# Patient Record
Sex: Female | Born: 1985 | Race: Black or African American | Hispanic: No | Marital: Single | Smoking: Never smoker
Health system: Southern US, Community
[De-identification: ages and names within clinical notes are randomized; demographics above are authoritative.]

## PROBLEM LIST (undated history)

## (undated) DIAGNOSIS — E282 Polycystic ovarian syndrome: Secondary | ICD-10-CM

## (undated) DIAGNOSIS — E7800 Pure hypercholesterolemia, unspecified: Secondary | ICD-10-CM

---

## 2006-07-31 ENCOUNTER — Inpatient Hospital Stay (HOSPITAL_COMMUNITY): Admission: AD | Admit: 2006-07-31 | Discharge: 2006-08-01 | Payer: Self-pay | Admitting: Obstetrics & Gynecology

## 2006-09-06 ENCOUNTER — Inpatient Hospital Stay (HOSPITAL_COMMUNITY): Admission: AD | Admit: 2006-09-06 | Discharge: 2006-09-06 | Payer: Self-pay | Admitting: Obstetrics

## 2006-09-13 ENCOUNTER — Inpatient Hospital Stay (HOSPITAL_COMMUNITY): Admission: EM | Admit: 2006-09-13 | Discharge: 2006-09-13 | Payer: Self-pay | Admitting: Obstetrics

## 2006-11-03 ENCOUNTER — Encounter (INDEPENDENT_AMBULATORY_CARE_PROVIDER_SITE_OTHER): Payer: Self-pay | Admitting: *Deleted

## 2006-11-03 ENCOUNTER — Inpatient Hospital Stay (HOSPITAL_COMMUNITY): Admission: RE | Admit: 2006-11-03 | Discharge: 2006-11-06 | Payer: Self-pay | Admitting: Obstetrics

## 2009-07-16 ENCOUNTER — Ambulatory Visit (HOSPITAL_COMMUNITY): Admission: RE | Admit: 2009-07-16 | Discharge: 2009-07-16 | Payer: Self-pay | Admitting: Obstetrics

## 2009-09-11 ENCOUNTER — Ambulatory Visit (HOSPITAL_COMMUNITY): Admission: RE | Admit: 2009-09-11 | Discharge: 2009-09-11 | Payer: Self-pay | Admitting: Obstetrics

## 2009-10-09 IMAGING — US US OB COMP +14 WK
2 series · 14 of 28 positions shown · non-contrast
Comparison: none

OBSTETRICAL ULTRASOUND:
 This ultrasound exam was performed in the [HOSPITAL] Ultrasound Department.  The OB US report was generated in the AS system, and faxed to the ordering physician.  This report is also available in [REDACTED] PACS.

[Series 1: us ob comp +14 wk · 0.21mm/px · 2 of 11 slices shown (1 of 2)]
[im 3/11]
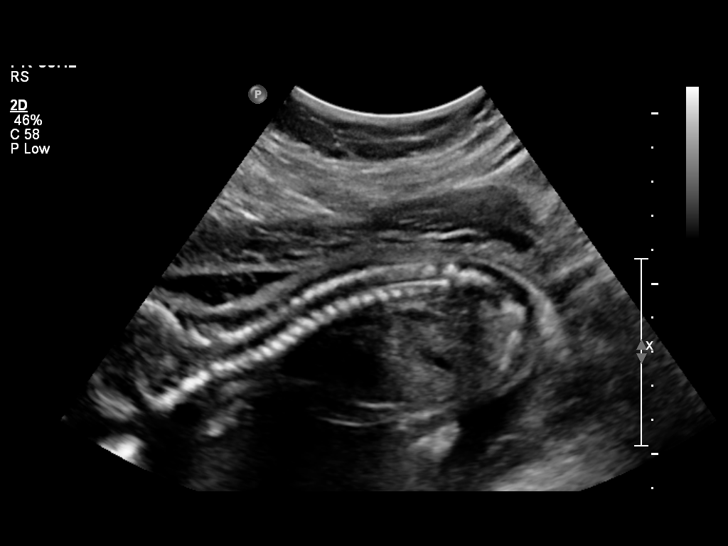
[im 8/11]
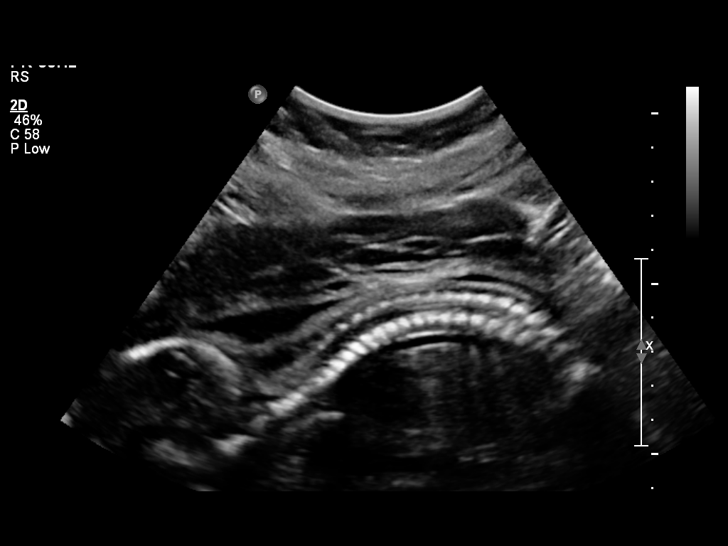

[Series 1: us ob comp +14 wk · 0.20mm/px · 12 of 55 slices shown (2 of 2)]
[im 1/55]
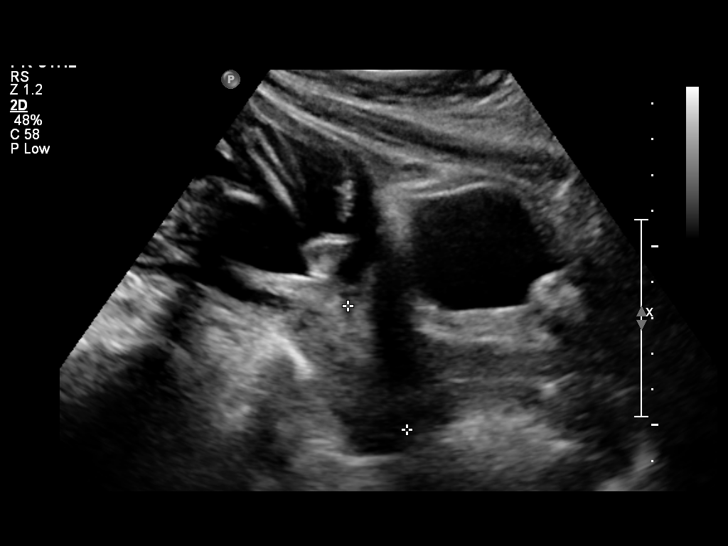
[im 5/55]
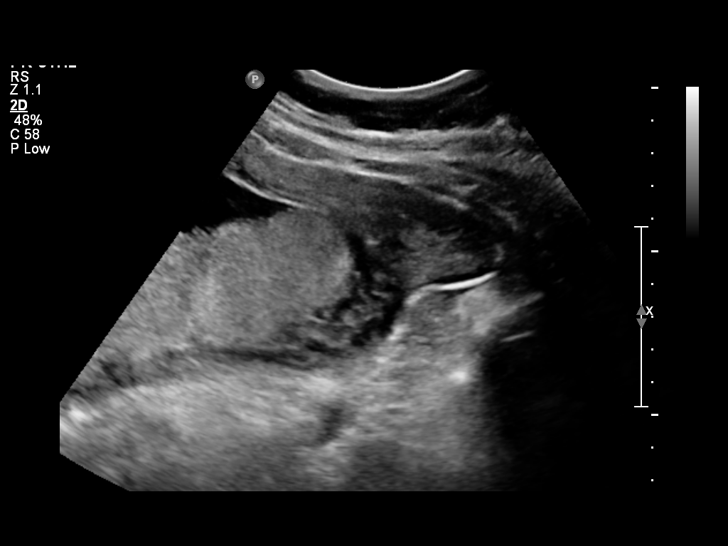
[im 10/55]
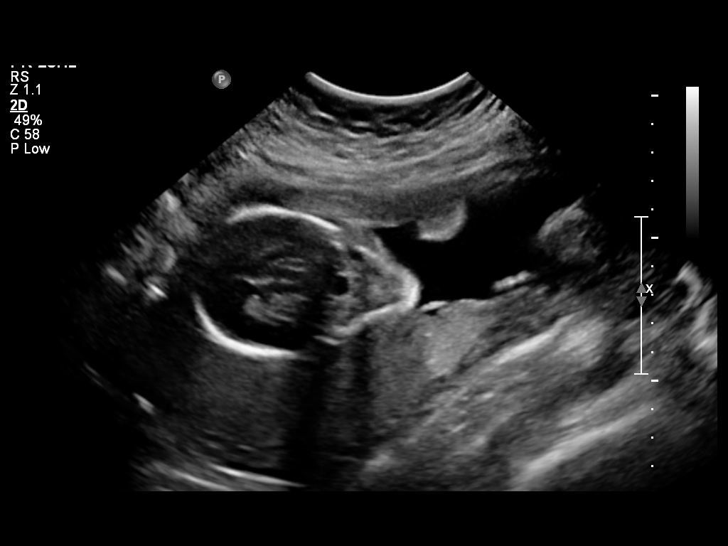
[im 15/55]
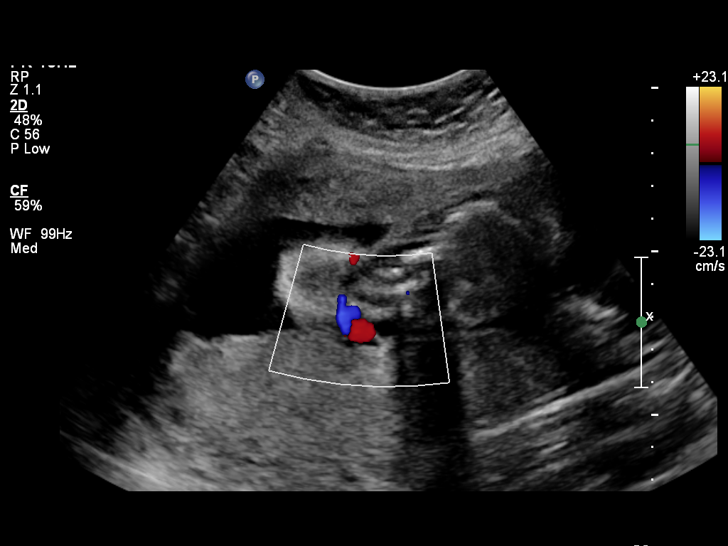
[im 20/55]
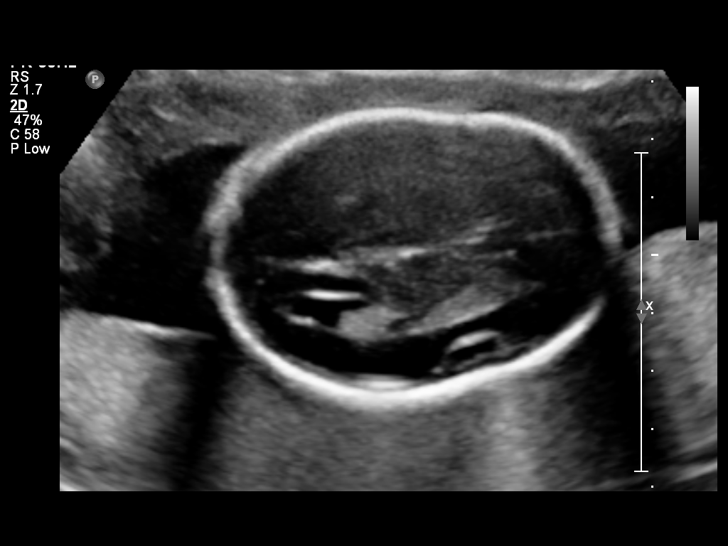
[im 25/55]
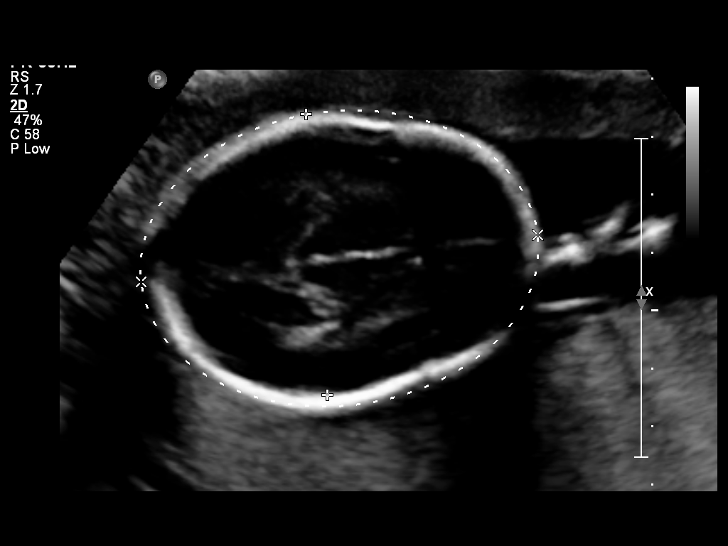
[im 30/55]
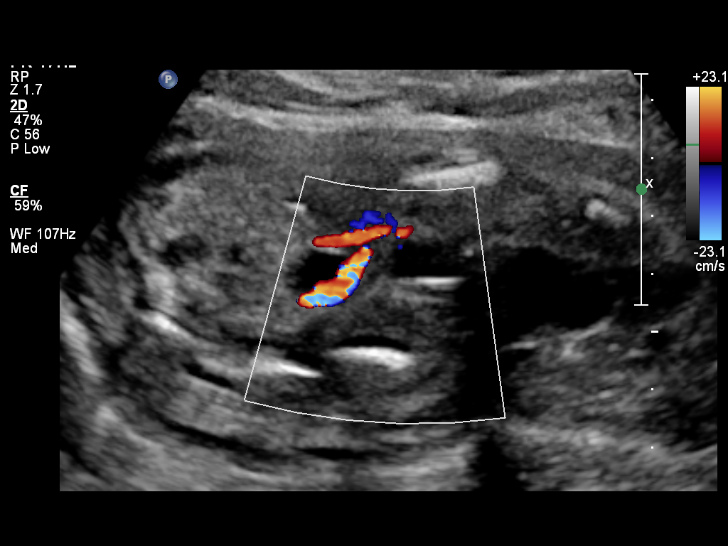
[im 35/55]
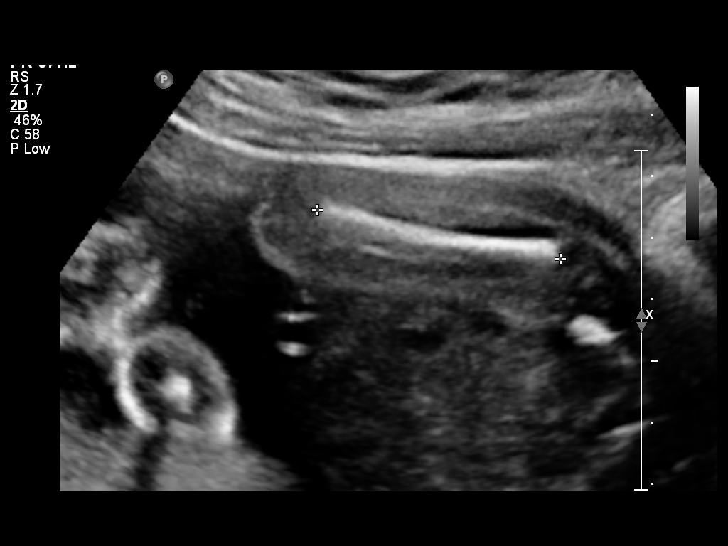
[im 40/55]
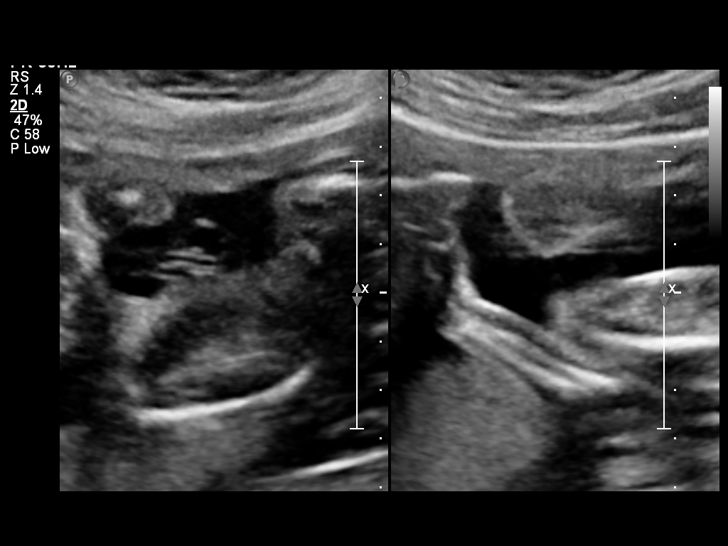
[im 45/55]
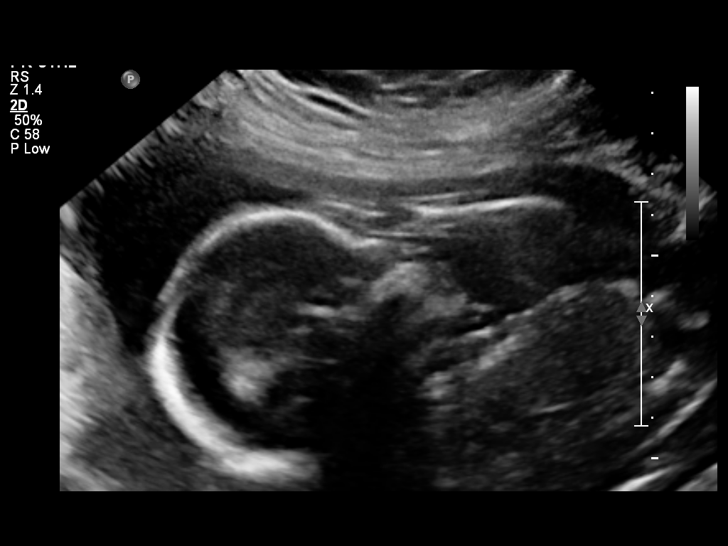
[im 50/55]
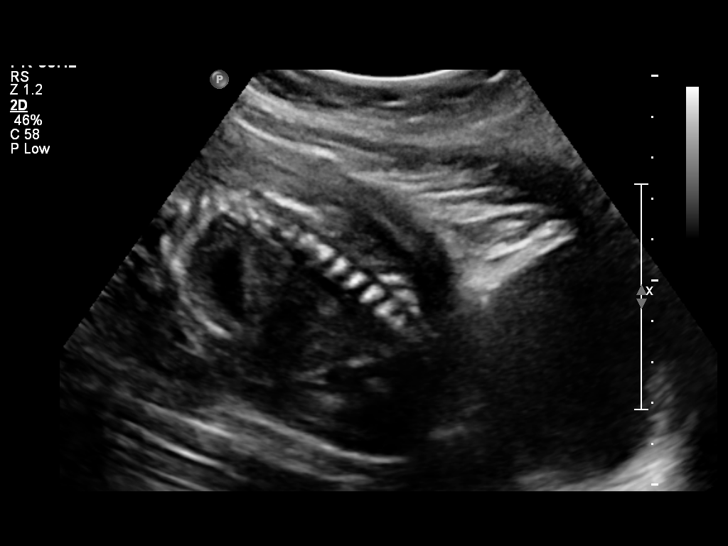
[im 55/55]
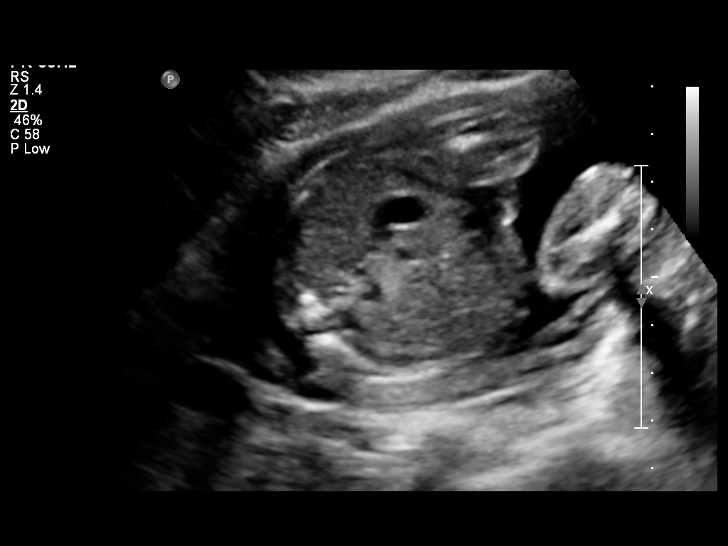

[14 of 28 positions shown; findings below may reference images not displayed]

IMPRESSION: See AS Obstetric US report.

## 2009-11-20 ENCOUNTER — Inpatient Hospital Stay (HOSPITAL_COMMUNITY): Admission: EM | Admit: 2009-11-20 | Discharge: 2009-11-23 | Payer: Self-pay | Admitting: Obstetrics

## 2011-03-07 LAB — CBC
HCT: 26.8 % — ABNORMAL LOW (ref 36.0–46.0)
MCHC: 31.8 g/dL (ref 30.0–36.0)
MCHC: 31.8 g/dL (ref 30.0–36.0)
MCV: 75.2 fL — ABNORMAL LOW (ref 78.0–100.0)
Platelets: 203 10*3/uL (ref 150–400)
RBC: 4.47 MIL/uL (ref 3.87–5.11)
WBC: 9.6 10*3/uL (ref 4.0–10.5)

## 2011-03-07 LAB — RPR: RPR Ser Ql: NONREACTIVE

## 2011-03-30 ENCOUNTER — Inpatient Hospital Stay (INDEPENDENT_AMBULATORY_CARE_PROVIDER_SITE_OTHER)
Admission: RE | Admit: 2011-03-30 | Discharge: 2011-03-30 | Disposition: A | Discharge status: Home / Self Care | Payer: BC Managed Care – PPO | Source: Ambulatory Visit | Attending: Emergency Medicine | Admitting: Emergency Medicine

## 2011-03-30 DIAGNOSIS — M545 Low back pain: Secondary | ICD-10-CM

## 2011-03-30 LAB — POCT URINALYSIS DIP (DEVICE)
Glucose, UA: NEGATIVE mg/dL
Specific Gravity, Urine: >=1.03 (ref 1.005–1.030)
Urobilinogen, UA: 2 mg/dL — ABNORMAL HIGH (ref 0.0–1.0)
pH: 6.5 (ref 5.0–8.0)

## 2011-03-30 LAB — POCT PREGNANCY, URINE: Preg Test, Ur: NEGATIVE

## 2011-04-22 NOTE — Discharge Summary (Signed)
NAMELATORSHA, CURLING NO.:  [id number]   MEDICAL RECORD NO.:  [id number]          PATIENT TYPE:  INP   LOCATION:  9107                          FACILITY:  [facility]   PHYSICIAN:  Kathreen Cosier, M.D.DATE OF BIRTH:  [birth year]   DATE OF ADMISSION:  11/03/2006  DATE OF DISCHARGE:  11/06/2006                               DISCHARGE SUMMARY   The patient is a 25 year old primigravida, [facility] November 24 who was  admitted with ruptured membranes. Fluid was clear. She had a positive  GBS. The cervix was 1 cm, 70%, vertex -3. the patient underwent primary  low transverse Cesarean section because of failure to progress in labor.  Postoperatively, her hemoglobin was 10.7. RPR negative. Urine negative.  She did well and was discharged on the third postoperative day,  ambulatory, on a regular diet, to see me in six weeks.   DISCHARGE DIAGNOSIS:  Status post primary low transverse Cesarean  section at term for failure to progress in labor.           ______________________________  Kathreen Cosier, M.D.     BAM/MEDQ  D:  11/22/2006  T:  11/22/2006  Job:  [id number]

## 2011-04-22 NOTE — H&P (Signed)
NAMEJAYCEY, Amber Collins NO.:  [id number]   MEDICAL RECORD NO.:  [id number]          PATIENT TYPE:  INP   LOCATION:  9107                          FACILITY:  [facility]   PHYSICIAN:  Kathreen Cosier, M.D.DATE OF BIRTH:  [birth year]   DATE OF ADMISSION:  11/03/2006  DATE OF DISCHARGE:                              HISTORY & PHYSICAL   The patient is a 25 year old Primigravida, [facility] October 28, 2006,  admitted with ruptured membranes which occurred at 6 a.m.  Fluid was  clear.  She was having occasional contractions.  Positive GBS, was  treated with penicillin.  Cervix 1-cm, 70%, vertex, minus 3.  She was  started on Pitocin stimulation and in the early afternoon she was in  good labor and _______.  By 5 p.m. she was 5-cm, 100%, vertex, minus 3.  By 9:30 p.m. she was unchanged except for significant molding.  It was  decided she would deliver by C-section because of failure to progress.   PHYSICAL EXAMINATION:  GENERAL:  Revealed a well developed female in  labor.  HEENT:  Negative.  LUNGS:  Clear.  HEART:  Regular rhythm.  No murmurs.  No gallops.  BREASTS:  No masses.  ABDOMEN:  Term size uterus.  Estimated fetal weight 7.5 ounces.  EXTREMITIES:  Negative.           ______________________________  Kathreen Cosier, M.D.     BAM/MEDQ  D:  11/03/2006  T:  11/04/2006  Job:  [id number]

## 2011-04-22 NOTE — Op Note (Signed)
Amber Collins, DETORE NO.:  [id number]   MEDICAL RECORD NO.:  [id number]          PATIENT TYPE:  INP   LOCATION:  9107                          FACILITY:  [facility]   PHYSICIAN:  Kathreen Cosier, M.D.DATE OF BIRTH:  Mar 27, 1986   DATE OF PROCEDURE:  11/03/2006  DATE OF DISCHARGE:                               OPERATIVE REPORT   PREOPERATIVE DIAGNOSIS:  Failure to progress in labor.   SURGEON:  Kathreen Cosier, M.D.   ANESTHESIA:  Epidural.   DESCRIPTION OF PROCEDURE:  The patient placed on the operating table in  the supine position.  Abdomen prepped and draped.  Bladder emptied with  Foley catheter.  Transverse suprapubic incision made, carried down  through the rectus fascia.  The fascia was cleaned and incised the  length of the incision.  Rectus muscle were retracted laterally.  The  peritoneum was incised longitudinally.  Transverse incision made in the  vesicouterine peritoneum above the bladder.  The bladder mobilized  inferiorly.  Transverse lower uterine incision made.  The patient was  delivered from the OP position of a female, Apgars 8 and 9, weighing 6  pounds 11 ounces.  The team was in attendance.  Fluid was clear.  Placenta was posterior and removed manually.  Uterine cavity was cleaned  with dry laps.  Uterine incision closed in one layer with continuous  suture of #1 chromic.  Tubes and ovaries normal.  Uterus well  contracted.  Bladder flap reattached with 2-0 chromic.  Abdomen closed  in layers.  Peritoneum with continuous suture of 0 chromic, fascia with  continuous suture of 0 Dexon and skin closed with subcuticular stitch of  4-0 Monocryl.  Blood loss 500 mL.           ______________________________  Kathreen Cosier, M.D.     BAM/MEDQ  D:  11/03/2006  T:  11/05/2006  Job:  [id number]

## 2011-08-21 ENCOUNTER — Inpatient Hospital Stay (INDEPENDENT_AMBULATORY_CARE_PROVIDER_SITE_OTHER)
Admission: RE | Admit: 2011-08-21 | Discharge: 2011-08-21 | Disposition: A | Discharge status: Home / Self Care | Payer: BC Managed Care – PPO | Source: Ambulatory Visit | Attending: Emergency Medicine | Admitting: Emergency Medicine

## 2011-08-21 DIAGNOSIS — N760 Acute vaginitis: Secondary | ICD-10-CM

## 2011-08-21 LAB — POCT URINALYSIS DIP (DEVICE)
Ketones, ur: NEGATIVE mg/dL
pH: 5.5 (ref 5.0–8.0)

## 2011-08-21 LAB — POCT PREGNANCY, URINE: Preg Test, Ur: NEGATIVE

## 2011-08-21 LAB — WET PREP, GENITAL

## 2011-08-23 LAB — GC/CHLAMYDIA PROBE AMP, GENITAL: Chlamydia, DNA Probe: NEGATIVE

## 2012-06-07 ENCOUNTER — Encounter (HOSPITAL_COMMUNITY): Payer: Self-pay | Admitting: Emergency Medicine

## 2012-06-07 ENCOUNTER — Emergency Department (HOSPITAL_COMMUNITY)
Admission: EM | Admit: 2012-06-07 | Discharge: 2012-06-07 | Disposition: A | Discharge status: Home / Self Care | Payer: BC Managed Care – PPO | Attending: Emergency Medicine | Admitting: Emergency Medicine

## 2012-06-07 DIAGNOSIS — R070 Pain in throat: Secondary | ICD-10-CM | POA: Insufficient documentation

## 2012-06-07 DIAGNOSIS — IMO0001 Reserved for inherently not codable concepts without codable children: Secondary | ICD-10-CM | POA: Insufficient documentation

## 2012-06-07 DIAGNOSIS — J029 Acute pharyngitis, unspecified: Secondary | ICD-10-CM

## 2012-06-07 DIAGNOSIS — R6883 Chills (without fever): Secondary | ICD-10-CM | POA: Insufficient documentation

## 2012-06-07 LAB — RAPID STREP SCREEN (MED CTR MEBANE ONLY): Streptococcus, Group A Screen (Direct): NEGATIVE

## 2012-06-07 MED ORDER — HYDROCODONE-ACETAMINOPHEN 7.5-500 MG/15ML PO SOLN: 15.0000 mL | Freq: Four times a day (QID) | ORAL | Status: AC | PRN

## 2012-06-07 MED ORDER — IBUPROFEN 400 MG PO TABS
800.0000 mg | ORAL_TABLET | Freq: Once | ORAL | Status: AC
  Administered 2012-06-07: 800 mg via ORAL
  Filled 2012-06-07 (×2): qty 1

## 2012-06-07 MED ORDER — DEXAMETHASONE SODIUM PHOSPHATE 10 MG/ML IJ SOLN
10.0000 mg | Freq: Once | INTRAMUSCULAR | Status: AC
  Administered 2012-06-07: 10 mg via INTRAMUSCULAR
  Filled 2012-06-07: qty 1

## 2012-06-07 NOTE — ED Provider Notes (Signed)
History   This chart was scribed for Amber Octave, MD by Amber Collins. The patient was seen in room TR04C/TR04C. Patient's care was started at 1318.  CSN: [id number]  Arrival date & time 06/07/12  1318   First MD Initiated Contact with Patient 06/07/12 1334      Chief Complaint  Patient presents with  . Sore Throat   The history is provided by the patient. No language interpreter was used.    Amber Collins is a 26 y.o. female who presents to the Emergency Department complaining of sudden onset moderate sever constant worsening sore throat onset 3 days ago. Pt reports pain on both sides, aggravation with swallowing, and associated mild chills and cough. She has treated symptoms with chloracetic and salt water with no relief. Yesterday pt experience slight chest tightness although it has subsided. She lists that her child's father has been sick this past week. Pt denies fever and abdominal pain. Pt denies smoking and list no pertinent medical or surgical history.  History reviewed. No pertinent past medical history.  History reviewed. No pertinent past surgical history.  No family history on file.  History  Substance Use Topics  . Smoking status: Not on file  . Smokeless tobacco: Not on file  . Alcohol Use: No    Review of Systems  Constitutional: Positive for chills. Negative for fever.  HENT: Positive for sore throat. Negative for rhinorrhea.   Eyes: Negative for pain.  Respiratory: Positive for cough. Negative for shortness of breath.   Cardiovascular: Negative for chest pain.  Gastrointestinal: Negative for nausea, vomiting, abdominal pain and diarrhea.  Genitourinary: Negative for dysuria.  Musculoskeletal: Negative for back pain.  Skin: Negative for rash.  Neurological: Negative for weakness and headaches.    Allergies  Review of patient's allergies indicates no known allergies.  Home Medications  No current outpatient prescriptions on file.  BP 137/86   Pulse 95  Temp 98.7 F (37.1 C) (Oral)  Resp 16  SpO2 96%  LMP 05/25/2012  Physical Exam  Nursing note and vitals reviewed. Constitutional: She is oriented to person, place, and time. She appears well-developed and well-nourished. No distress.  HENT:  Head: Normocephalic and atraumatic.  Right Ear: External ear normal.  Left Ear: External ear normal.  Mouth/Throat: No oropharyngeal exudate.       +3 tonsil bilaterally no assymetry no exadate. No meningismus.  Eyes: EOM are normal. Pupils are equal, round, and reactive to light. No scleral icterus.  Neck: Neck supple. No tracheal deviation present. No thyromegaly present.  Cardiovascular: Normal rate.   Pulmonary/Chest: Effort normal. No respiratory distress.  Abdominal: Soft. She exhibits no distension. There is no tenderness.  Musculoskeletal: Normal range of motion. She exhibits no edema.  Lymphadenopathy:    She has no cervical adenopathy.  Neurological: She is alert and oriented to person, place, and time. No sensory deficit.  Skin: Skin is warm and dry. No pallor.  Psychiatric: She has a normal mood and affect. Her behavior is normal.    ED Course  Procedures (including critical care time) DIAGNOSTIC STUDIES: Oxygen Saturation is 96% on room air, adequate by my interpretation.    COORDINATION OF CARE: 1322- Evaluated Pt's present illness.    Labs Reviewed  RAPID STREP SCREEN   No results found.   No diagnosis found.    MDM  3 days of sore throat, body aches, chills, pain with swallowing.  Large tonsils without asymmetry or exudate.  No trismus, difficulty breathing or  swallowing.  Rapid strep negative. Nontoxic appearnace. Supportive care of viral pharyngitis.  I personally performed the services described in this documentation, which was scribed in my presence.  The recorded information has been reviewed and considered.     Amber Octave, MD 06/07/12 [phone]

## 2012-06-07 NOTE — ED Notes (Signed)
Pt. Stated, I've had sore throat, earache and headache since Mon.

## 2013-12-26 LAB — PROCEDURE REPORT - SCANNED: PAP SMEAR: NEGATIVE

## 2016-08-29 ENCOUNTER — Encounter: Payer: Self-pay | Admitting: *Deleted

## 2022-11-30 ENCOUNTER — Ambulatory Visit
Admission: EM | Admit: 2022-11-30 | Discharge: 2022-11-30 | Disposition: A | Discharge status: Home / Self Care | Payer: Commercial Managed Care - HMO | Attending: Internal Medicine | Admitting: Internal Medicine

## 2022-11-30 DIAGNOSIS — J018 Other acute sinusitis: Secondary | ICD-10-CM

## 2022-11-30 DIAGNOSIS — R053 Chronic cough: Secondary | ICD-10-CM | POA: Diagnosis not present

## 2022-11-30 DIAGNOSIS — R7303 Prediabetes: Secondary | ICD-10-CM | POA: Diagnosis not present

## 2022-11-30 DIAGNOSIS — R509 Fever, unspecified: Secondary | ICD-10-CM | POA: Diagnosis not present

## 2022-11-30 HISTORY — DX: Pure hypercholesterolemia, unspecified: E78.00

## 2022-11-30 HISTORY — DX: Polycystic ovarian syndrome: E28.2

## 2022-11-30 MED ORDER — ACETAMINOPHEN 325 MG PO TABS: 650.0000 mg | ORAL_TABLET | Freq: Four times a day (QID) | ORAL | 0 refills | Status: AC | PRN

## 2022-11-30 MED ORDER — PSEUDOEPHEDRINE HCL 60 MG PO TABS: 60.0000 mg | ORAL_TABLET | Freq: Three times a day (TID) | ORAL | 0 refills | Status: AC | PRN

## 2022-11-30 MED ORDER — CETIRIZINE HCL 10 MG PO TABS: 10.0000 mg | ORAL_TABLET | Freq: Every day | ORAL | 0 refills | Status: AC

## 2022-11-30 MED ORDER — AMOXICILLIN-POT CLAVULANATE 875-125 MG PO TABS: 1.0000 | ORAL_TABLET | Freq: Two times a day (BID) | ORAL | 0 refills | Status: AC

## 2022-11-30 MED ORDER — ACETAMINOPHEN 325 MG PO TABS
650.0000 mg | ORAL_TABLET | Freq: Once | ORAL | Status: AC
  Administered 2022-11-30: 650 mg via ORAL

## 2022-11-30 NOTE — ED Provider Notes (Signed)
[facility]  Note:  This document was prepared using Systems analyst and may include unintentional dictation errors.  MRN: [id number] DOB: [birth year]  Subjective:   Amber Collins is a 36 y.o. female presenting for 3 week history of persistent coughing, drainage.  In the past 2 days has gotten significantly worse including sinus pressure, facial pain, worsening sinus congestion, bilateral ear pain, dental pain throughout.  No chest pain, shortness of breath or wheezing.  Has OSA, does not use CPAP.   No current facility-administered medications for this encounter. No current outpatient medications on file.   No Known Allergies  Past Medical History:  Diagnosis Date   High cholesterol    PCOS (polycystic ovarian syndrome)      History reviewed. No pertinent surgical history.  No family history on file.  Social History   Tobacco Use   Smoking status: Never   Smokeless tobacco: Never  Vaping Use   Vaping Use: Never used  Substance Use Topics   Alcohol use: No   Drug use: No    ROS   Objective:   Vitals: BP (!) 142/77 (BP Location: Right Arm)   Pulse (!) 103   Temp (!) 101 F (38.3 C) (Oral)   Resp 15   SpO2 96%   Physical Exam Constitutional:      General: She is not in acute distress.    Appearance: Normal appearance. She is well-developed and normal weight. She is not ill-appearing, toxic-appearing or diaphoretic.  HENT:     Head: Normocephalic and atraumatic.     Right Ear: Tympanic membrane, ear canal and external ear normal. No drainage or tenderness. No middle ear effusion. There is no impacted cerumen. Tympanic membrane is not erythematous or bulging.     Left Ear: Tympanic membrane, ear canal and external ear normal. No drainage or tenderness.  No middle ear effusion. There is no impacted cerumen. Tympanic membrane is not erythematous or bulging.     Nose: Congestion present. No rhinorrhea.      Comments: Bilateral maxillary sinus tenderness.    Mouth/Throat:     Mouth: Mucous membranes are moist. No oral lesions.     Pharynx: No pharyngeal swelling, oropharyngeal exudate, posterior oropharyngeal erythema or uvula swelling.     Tonsils: No tonsillar exudate or tonsillar abscesses.  Eyes:     General: No scleral icterus.       Right eye: No discharge.        Left eye: No discharge.     Extraocular Movements: Extraocular movements intact.     Right eye: Normal extraocular motion.     Left eye: Normal extraocular motion.     Conjunctiva/sclera: Conjunctivae normal.  Cardiovascular:     Rate and Rhythm: Normal rate and regular rhythm.     Heart sounds: Normal heart sounds. No murmur heard.    No friction rub. No gallop.  Pulmonary:     Effort: Pulmonary effort is normal. No respiratory distress.     Breath sounds: No stridor. No wheezing, rhonchi or rales.  Chest:     Chest wall: No tenderness.  Musculoskeletal:     Cervical back: Normal range of motion and neck supple.  Lymphadenopathy:     Cervical: No cervical adenopathy.  Skin:    General: Skin is warm and dry.  Neurological:     General: No focal deficit present.     Mental Status: She is alert and oriented to person, place, and time.  Psychiatric:        Mood and Affect: Mood normal.        Behavior: Behavior normal.       Assessment and Plan :   PDMP not reviewed this encounter.  1. Other acute sinusitis, recurrence not specified   2. Persistent cough   3. Fever, unspecified   4. Prediabetes     No signs of otitis media. Deferred imaging given clear cardiopulmonary exam, hemodynamically stable vital signs. Will start empiric treatment for sinusitis with Augmentin.  Recommended supportive care otherwise. Counseled patient on potential for adverse effects with medications prescribed/recommended today, ER and return-to-clinic precautions discussed, patient verbalized understanding.    Wallis Bamberg,  [place] 12/01/22 [phone]

## 2022-11-30 NOTE — ED Triage Notes (Signed)
Pt c/o sinus pain/pressure, nasal congestion, bilat earache, teeth pain x 2 days-cough x 3 weeks-NAD-steady gait

## 2022-12-20 DIAGNOSIS — Z1329 Encounter for screening for other suspected endocrine disorder: Secondary | ICD-10-CM | POA: Diagnosis not present

## 2022-12-20 DIAGNOSIS — Z139 Encounter for screening, unspecified: Secondary | ICD-10-CM | POA: Diagnosis not present

## 2022-12-20 DIAGNOSIS — Z124 Encounter for screening for malignant neoplasm of cervix: Secondary | ICD-10-CM | POA: Diagnosis not present

## 2022-12-20 DIAGNOSIS — Z130 Encounter for screening for diseases of the blood and blood-forming organs and certain disorders involving the immune mechanism: Secondary | ICD-10-CM | POA: Diagnosis not present

## 2022-12-20 DIAGNOSIS — Z01419 Encounter for gynecological examination (general) (routine) without abnormal findings: Secondary | ICD-10-CM | POA: Diagnosis not present

## 2022-12-20 DIAGNOSIS — Z809 Family history of malignant neoplasm, unspecified: Secondary | ICD-10-CM | POA: Diagnosis not present

## 2022-12-20 DIAGNOSIS — Z6841 Body Mass Index (BMI) 40.0 and over, adult: Secondary | ICD-10-CM | POA: Diagnosis not present

## 2022-12-20 DIAGNOSIS — Z113 Encounter for screening for infections with a predominantly sexual mode of transmission: Secondary | ICD-10-CM | POA: Diagnosis not present

## 2022-12-20 DIAGNOSIS — Z1321 Encounter for screening for nutritional disorder: Secondary | ICD-10-CM | POA: Diagnosis not present

## 2022-12-20 DIAGNOSIS — E282 Polycystic ovarian syndrome: Secondary | ICD-10-CM | POA: Diagnosis not present

## 2022-12-20 DIAGNOSIS — Z309 Encounter for contraceptive management, unspecified: Secondary | ICD-10-CM | POA: Diagnosis not present

## 2022-12-20 DIAGNOSIS — Z13228 Encounter for screening for other metabolic disorders: Secondary | ICD-10-CM | POA: Diagnosis not present

## 2022-12-20 DIAGNOSIS — R69 Illness, unspecified: Secondary | ICD-10-CM | POA: Diagnosis not present

## 2022-12-20 DIAGNOSIS — Z13220 Encounter for screening for lipoid disorders: Secondary | ICD-10-CM | POA: Diagnosis not present

## 2023-01-06 DIAGNOSIS — E559 Vitamin D deficiency, unspecified: Secondary | ICD-10-CM | POA: Diagnosis not present

## 2023-01-06 DIAGNOSIS — E669 Obesity, unspecified: Secondary | ICD-10-CM | POA: Diagnosis not present

## 2023-01-06 DIAGNOSIS — E1169 Type 2 diabetes mellitus with other specified complication: Secondary | ICD-10-CM | POA: Diagnosis not present

## 2023-01-06 DIAGNOSIS — M5431 Sciatica, right side: Secondary | ICD-10-CM | POA: Diagnosis not present

## 2023-01-06 DIAGNOSIS — E782 Mixed hyperlipidemia: Secondary | ICD-10-CM | POA: Diagnosis not present

## 2023-01-06 DIAGNOSIS — R030 Elevated blood-pressure reading, without diagnosis of hypertension: Secondary | ICD-10-CM | POA: Diagnosis not present

## 2023-02-17 DIAGNOSIS — F439 Reaction to severe stress, unspecified: Secondary | ICD-10-CM | POA: Diagnosis not present

## 2023-02-24 DIAGNOSIS — F439 Reaction to severe stress, unspecified: Secondary | ICD-10-CM | POA: Diagnosis not present

## 2023-03-17 DIAGNOSIS — F439 Reaction to severe stress, unspecified: Secondary | ICD-10-CM | POA: Diagnosis not present

## 2023-04-07 DIAGNOSIS — F439 Reaction to severe stress, unspecified: Secondary | ICD-10-CM | POA: Diagnosis not present

## 2023-04-14 DIAGNOSIS — F439 Reaction to severe stress, unspecified: Secondary | ICD-10-CM | POA: Diagnosis not present

## 2023-04-17 DIAGNOSIS — E1165 Type 2 diabetes mellitus with hyperglycemia: Secondary | ICD-10-CM | POA: Diagnosis not present

## 2023-04-17 DIAGNOSIS — E782 Mixed hyperlipidemia: Secondary | ICD-10-CM | POA: Diagnosis not present

## 2023-04-28 DIAGNOSIS — F439 Reaction to severe stress, unspecified: Secondary | ICD-10-CM | POA: Diagnosis not present

## 2023-05-12 DIAGNOSIS — F439 Reaction to severe stress, unspecified: Secondary | ICD-10-CM | POA: Diagnosis not present

## 2023-06-23 DIAGNOSIS — F439 Reaction to severe stress, unspecified: Secondary | ICD-10-CM | POA: Diagnosis not present

## 2023-07-20 DIAGNOSIS — E1165 Type 2 diabetes mellitus with hyperglycemia: Secondary | ICD-10-CM | POA: Diagnosis not present

## 2023-07-20 DIAGNOSIS — R5383 Other fatigue: Secondary | ICD-10-CM | POA: Diagnosis not present

## 2023-07-20 DIAGNOSIS — E559 Vitamin D deficiency, unspecified: Secondary | ICD-10-CM | POA: Diagnosis not present

## 2023-07-20 DIAGNOSIS — E782 Mixed hyperlipidemia: Secondary | ICD-10-CM | POA: Diagnosis not present

## 2023-07-24 DIAGNOSIS — E119 Type 2 diabetes mellitus without complications: Secondary | ICD-10-CM | POA: Diagnosis not present

## 2023-07-24 DIAGNOSIS — Z789 Other specified health status: Secondary | ICD-10-CM | POA: Diagnosis not present

## 2023-07-24 DIAGNOSIS — E782 Mixed hyperlipidemia: Secondary | ICD-10-CM | POA: Diagnosis not present

## 2023-07-24 DIAGNOSIS — E559 Vitamin D deficiency, unspecified: Secondary | ICD-10-CM | POA: Diagnosis not present

## 2023-08-25 ENCOUNTER — Emergency Department (HOSPITAL_BASED_OUTPATIENT_CLINIC_OR_DEPARTMENT_OTHER)
Admission: EM | Admit: 2023-08-25 | Discharge: 2023-08-25 | Disposition: A | Discharge status: Home / Self Care | Payer: Medicaid Other | Attending: Emergency Medicine | Admitting: Emergency Medicine

## 2023-08-25 ENCOUNTER — Encounter (HOSPITAL_BASED_OUTPATIENT_CLINIC_OR_DEPARTMENT_OTHER): Payer: Self-pay | Admitting: Emergency Medicine

## 2023-08-25 ENCOUNTER — Other Ambulatory Visit: Payer: Self-pay

## 2023-08-25 DIAGNOSIS — R519 Headache, unspecified: Secondary | ICD-10-CM | POA: Diagnosis not present

## 2023-08-25 DIAGNOSIS — Y92410 Unspecified street and highway as the place of occurrence of the external cause: Secondary | ICD-10-CM | POA: Insufficient documentation

## 2023-08-25 DIAGNOSIS — M546 Pain in thoracic spine: Secondary | ICD-10-CM | POA: Diagnosis not present

## 2023-08-25 MED ORDER — ACETAMINOPHEN 500 MG PO TABS
1000.0000 mg | ORAL_TABLET | Freq: Once | ORAL | Status: AC
  Administered 2023-08-25: 1000 mg via ORAL
  Filled 2023-08-25: qty 2

## 2023-08-25 MED ORDER — CYCLOBENZAPRINE HCL 10 MG PO TABS: 10.0000 mg | ORAL_TABLET | Freq: Two times a day (BID) | ORAL | 0 refills | Status: AC | PRN

## 2023-08-25 MED ORDER — LIDOCAINE 5 % EX PTCH
1.0000 | MEDICATED_PATCH | CUTANEOUS | Status: DC
  Administered 2023-08-25: 1 via TRANSDERMAL
  Filled 2023-08-25: qty 1
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
## 2023-09-11 ENCOUNTER — Ambulatory Visit (HOSPITAL_BASED_OUTPATIENT_CLINIC_OR_DEPARTMENT_OTHER): Payer: Medicaid Other | Admitting: Family Medicine

## 2023-09-27 ENCOUNTER — Telehealth: Payer: Self-pay | Admitting: *Deleted
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
## 2023-11-13 DIAGNOSIS — N760 Acute vaginitis: Secondary | ICD-10-CM | POA: Diagnosis not present

## 2023-11-13 DIAGNOSIS — Z113 Encounter for screening for infections with a predominantly sexual mode of transmission: Secondary | ICD-10-CM | POA: Diagnosis not present

## 2023-12-29 DIAGNOSIS — Z01419 Encounter for gynecological examination (general) (routine) without abnormal findings: Secondary | ICD-10-CM | POA: Diagnosis not present

## 2023-12-29 DIAGNOSIS — N898 Other specified noninflammatory disorders of vagina: Secondary | ICD-10-CM | POA: Diagnosis not present

## 2023-12-29 DIAGNOSIS — N760 Acute vaginitis: Secondary | ICD-10-CM | POA: Diagnosis not present

## 2023-12-29 DIAGNOSIS — E282 Polycystic ovarian syndrome: Secondary | ICD-10-CM | POA: Diagnosis not present

## 2023-12-29 DIAGNOSIS — Z6841 Body Mass Index (BMI) 40.0 and over, adult: Secondary | ICD-10-CM | POA: Diagnosis not present

## 2023-12-29 DIAGNOSIS — Z113 Encounter for screening for infections with a predominantly sexual mode of transmission: Secondary | ICD-10-CM | POA: Diagnosis not present

## 2023-12-29 DIAGNOSIS — Z809 Family history of malignant neoplasm, unspecified: Secondary | ICD-10-CM | POA: Diagnosis not present

## 2024-01-04 DIAGNOSIS — Z0000 Encounter for general adult medical examination without abnormal findings: Secondary | ICD-10-CM | POA: Diagnosis not present

## 2024-01-04 DIAGNOSIS — Z13220 Encounter for screening for lipoid disorders: Secondary | ICD-10-CM | POA: Diagnosis not present

## 2024-01-10 DIAGNOSIS — Z0000 Encounter for general adult medical examination without abnormal findings: Secondary | ICD-10-CM | POA: Diagnosis not present

## 2024-07-13 ENCOUNTER — Other Ambulatory Visit: Payer: Self-pay

## 2024-07-13 ENCOUNTER — Ambulatory Visit (HOSPITAL_COMMUNITY)
Admission: RE | Admit: 2024-07-13 | Discharge: 2024-07-13 | Disposition: A | Discharge status: Home / Self Care | Source: Ambulatory Visit | Attending: Internal Medicine | Admitting: Internal Medicine

## 2024-07-13 ENCOUNTER — Encounter (HOSPITAL_COMMUNITY): Payer: Self-pay

## 2024-07-13 VITALS — BP 120/66 | HR 67 | Temp 99.1°F | Resp 20

## 2024-07-13 DIAGNOSIS — Z113 Encounter for screening for infections with a predominantly sexual mode of transmission: Secondary | ICD-10-CM | POA: Diagnosis present

## 2024-07-13 DIAGNOSIS — R350 Frequency of micturition: Secondary | ICD-10-CM | POA: Diagnosis present

## 2024-07-13 DIAGNOSIS — N898 Other specified noninflammatory disorders of vagina: Secondary | ICD-10-CM | POA: Diagnosis not present

## 2024-07-13 DIAGNOSIS — Z3202 Encounter for pregnancy test, result negative: Secondary | ICD-10-CM

## 2024-07-13 LAB — POCT URINALYSIS DIP (MANUAL ENTRY)
Bilirubin, UA: NEGATIVE
Blood, UA: NEGATIVE
Glucose, UA: NEGATIVE mg/dL
Ketones, POC UA: NEGATIVE mg/dL
Leukocytes, UA: NEGATIVE
Nitrite, UA: NEGATIVE
Protein Ur, POC: NEGATIVE mg/dL
Spec Grav, UA: 1.02 (ref 1.010–1.025)
Urobilinogen, UA: 1 U/dL
pH, UA: 7.5 (ref 5.0–8.0)

## 2024-07-13 LAB — POCT URINE PREGNANCY: Preg Test, Ur: NEGATIVE

## 2024-07-13 NOTE — Discharge Instructions (Addendum)
 Urine pregnancy test is negative. Urinalysis done today does not show an infection however given the symptoms, we will send the urine for a culture.  If this does show bacteria then we will contact you to start antibiotic treatment. Make sure to stay hydrated by drinking plenty of water.  Screening swab done today and results will be available in 24-48 hours. We will contact you if we need to arrange additional treatment based on your testing. Negative results will be on your MyChart account. Abstain from sex until you receive your final results and until after treatment if needed. If you have any worsening or changing symptoms including abnormal discharge, pelvic pain, abdominal pain, fever, nausea, or vomiting, then you should be reevaluated.

## 2024-07-13 NOTE — ED Triage Notes (Signed)
 Pt c/o vaginal discharge with odor  Also c/o urinary frequency x's 1 week.  Pt st's she wants to be tested for STD's and preg

## 2024-07-13 NOTE — ED Provider Notes (Signed)
 [facility]    CSN: [id number] Arrival date & time: 07/13/24  1522      History   Chief Complaint Chief Complaint  Patient presents with   SEXUALLY TRANSMITTED DISEASE    I need a std panel done including BV - Entered by patient    HPI Amber Collins is a 38 y.o. female.   38 year old female presents urgent care with complaints of vaginal discharge and urinary frequency.  She reports that her symptoms started a little over a week ago.  She has had similar symptoms previously that she has treated at home.  She reports that in the past when she has had urinary tract infections or yeast infections she has not really had symptoms and that the diagnosis was made by coincidence.  She reports that she has probiotics at home that she does not really take on a daily basis that are for females.  She also has boric acid that she is used for BV in the past.  She was concerned that she may have BV at this time.  She has had the same sexual partner for several months now.  She feels that her symptoms seem to start after intercourse. They do not use condoms or lubricants. Denies fevers, chills, abdominal pain, nausea, vomiting.     Past Medical History:  Diagnosis Date   High cholesterol    PCOS (polycystic ovarian syndrome)     There are no active problems to display for this patient.   History reviewed. No pertinent surgical history.  OB History   No obstetric history on file.      Home Medications    Prior to Admission medications   Medication Sig Start Date End Date Taking? Authorizing Provider  acetaminophen  (TYLENOL ) 325 MG tablet Take 2 tablets (650 mg total) by mouth every 6 (six) hours as needed for moderate pain. Patient not taking: Reported on 07/13/2024 11/30/22   Christopher Savannah, PA-C  amoxicillin -clavulanate (AUGMENTIN ) 875-125 MG tablet Take 1 tablet by mouth 2 (two) times daily. Patient not taking: Reported on 07/13/2024 11/30/22   Christopher Savannah, PA-C   cetirizine  (ZYRTEC  ALLERGY) 10 MG tablet Take 1 tablet (10 mg total) by mouth daily. Patient not taking: Reported on 07/13/2024 11/30/22   Christopher Savannah, PA-C  cyclobenzaprine  (FLEXERIL ) 10 MG tablet Take 1 tablet (10 mg total) by mouth 2 (two) times daily as needed for muscle spasms. Patient not taking: Reported on 07/13/2024 08/25/23   Yolande Lamar BROCKS, MD  pseudoephedrine  (SUDAFED) 60 MG tablet Take 1 tablet (60 mg total) by mouth every 8 (eight) hours as needed for congestion. Patient not taking: Reported on 07/13/2024 11/30/22   Christopher Savannah, PA-C    Family History No family history on file.  Social History Social History   Tobacco Use   Smoking status: Never   Smokeless tobacco: Never  Vaping Use   Vaping status: Never Used  Substance Use Topics   Alcohol use: No   Drug use: No     Allergies   Patient has no known allergies.   Review of Systems Review of Systems  Constitutional:  Negative for chills and fever.  HENT:  Negative for ear pain and sore throat.   Eyes:  Negative for pain and visual disturbance.  Respiratory:  Negative for cough and shortness of breath.   Cardiovascular:  Negative for chest pain and palpitations.  Gastrointestinal:  Negative for abdominal pain and vomiting.  Genitourinary:  Positive for frequency and vaginal discharge. Negative  for dysuria and hematuria.  Musculoskeletal:  Negative for arthralgias and back pain.  Skin:  Negative for color change and rash.  Neurological:  Negative for seizures and syncope.  All other systems reviewed and are negative.    Physical Exam Triage Vital Signs ED Triage Vitals  Encounter Vitals Group     BP 07/13/24 1541 120/66     Girls Systolic BP Percentile --      Girls Diastolic BP Percentile --      Boys Systolic BP Percentile --      Boys Diastolic BP Percentile --      Pulse Rate 07/13/24 1541 67     Resp 07/13/24 1541 20     Temp 07/13/24 1541 99.1 F (37.3 C)     Temp Source 07/13/24 1541 Oral      SpO2 07/13/24 1541 98 %     Weight --      Height --      Head Circumference --      Peak Flow --      Pain Score 07/13/24 1543 0     Pain Loc --      Pain Education --      Exclude from Growth Chart --    No data found.  Updated Vital Signs BP 120/66 (BP Location: Right Arm)   Pulse 67   Temp 99.1 F (37.3 C) (Oral)   Resp 20   SpO2 98%   Visual Acuity Right Eye Distance:   Left Eye Distance:   Bilateral Distance:    Right Eye Near:   Left Eye Near:    Bilateral Near:     Physical Exam Vitals and nursing note reviewed.  Constitutional:      General: She is not in acute distress.    Appearance: She is well-developed.  HENT:     Head: Normocephalic and atraumatic.  Eyes:     Conjunctiva/sclera: Conjunctivae normal.  Cardiovascular:     Rate and Rhythm: Normal rate and regular rhythm.     Heart sounds: No murmur heard. Pulmonary:     Effort: Pulmonary effort is normal. No respiratory distress.     Breath sounds: Normal breath sounds.  Abdominal:     Palpations: Abdomen is soft.     Tenderness: There is no abdominal tenderness.  Musculoskeletal:        General: No swelling.     Cervical back: Neck supple.  Skin:    General: Skin is warm and dry.     Capillary Refill: Capillary refill takes less than 2 seconds.  Neurological:     Mental Status: She is alert.  Psychiatric:        Mood and Affect: Mood normal.      UC Treatments / Results  Labs (all labs ordered are listed, but only abnormal results are displayed) Labs Reviewed  POCT URINALYSIS DIP (MANUAL ENTRY) - Abnormal; Notable for the following components:      Result Value   Clarity, UA cloudy (*)    All other components within normal limits  POCT URINE PREGNANCY - Normal  URINE CULTURE  CERVICOVAGINAL ANCILLARY ONLY    EKG   Radiology No results found.  Procedures Procedures (including critical care time)  Medications Ordered in UC Medications - No data to display  Initial  Impression / Assessment and Plan / UC Course  I have reviewed the triage vital signs and the nursing notes.  Pertinent labs & imaging results that were available during my care  of the patient were reviewed by me and considered in my medical decision making (see chart for details).     Vaginal discharge - Plan: POCT urine pregnancy, POCT urine pregnancy  Urinary frequency - Plan: POC urinalysis dipstick, POCT urine pregnancy, POC urinalysis dipstick, POCT urine pregnancy  Screening examination for STI   Urine pregnancy test is negative. Urinalysis done today does not show an infection however given the symptoms, we will send the urine for a culture.  If this does show bacteria then we will contact you to start antibiotic treatment. Make sure to stay hydrated by drinking plenty of water.  Screening swab done today and results will be available in 24-48 hours. We will contact you if we need to arrange additional treatment based on your testing. Negative results will be on your MyChart account. Abstain from sex until you receive your final results and until after treatment if needed. If you have any worsening or changing symptoms including abnormal discharge, pelvic pain, abdominal pain, fever, nausea, or vomiting, then you should be reevaluated.    Final Clinical Impressions(s) / UC Diagnoses   Final diagnoses:  Vaginal discharge  Urinary frequency  Screening examination for STI     Discharge Instructions      Urine pregnancy test is negative. Urinalysis done today does not show an infection however given the symptoms, we will send the urine for a culture.  If this does show bacteria then we will contact you to start antibiotic treatment. Make sure to stay hydrated by drinking plenty of water.  Screening swab done today and results will be available in 24-48 hours. We will contact you if we need to arrange additional treatment based on your testing. Negative results will be on your  MyChart account. Abstain from sex until you receive your final results and until after treatment if needed. If you have any worsening or changing symptoms including abnormal discharge, pelvic pain, abdominal pain, fever, nausea, or vomiting, then you should be reevaluated.      ED Prescriptions   None    PDMP not reviewed this encounter.   Teresa Almarie LABOR, [place] 07/13/24 [phone]

## 2024-07-14 LAB — URINE CULTURE: Culture: NO GROWTH

## 2024-07-15 ENCOUNTER — Ambulatory Visit (HOSPITAL_COMMUNITY): Payer: Self-pay

## 2024-07-15 LAB — CERVICOVAGINAL ANCILLARY ONLY
Bacterial Vaginitis (gardnerella): POSITIVE — AB
Candida Glabrata: NEGATIVE
Candida Vaginitis: NEGATIVE
Chlamydia: NEGATIVE
Comment: NEGATIVE
Comment: NEGATIVE
Comment: NEGATIVE
Comment: NEGATIVE
Comment: NEGATIVE
Comment: NORMAL
Neisseria Gonorrhea: NEGATIVE
Trichomonas: NEGATIVE

## 2024-07-15 MED ORDER — METRONIDAZOLE 500 MG PO TABS: 500.0000 mg | ORAL_TABLET | Freq: Two times a day (BID) | ORAL | 0 refills | Status: AC

## 2024-11-09 ENCOUNTER — Encounter (HOSPITAL_COMMUNITY): Payer: Self-pay | Admitting: *Deleted

## 2024-11-09 ENCOUNTER — Ambulatory Visit (HOSPITAL_COMMUNITY)
Admission: EM | Admit: 2024-11-09 | Discharge: 2024-11-09 | Disposition: A | Discharge status: Home / Self Care | Attending: Emergency Medicine | Admitting: Emergency Medicine

## 2024-11-09 ENCOUNTER — Other Ambulatory Visit: Payer: Self-pay

## 2024-11-09 DIAGNOSIS — R350 Frequency of micturition: Secondary | ICD-10-CM | POA: Diagnosis not present

## 2024-11-09 DIAGNOSIS — Z3202 Encounter for pregnancy test, result negative: Secondary | ICD-10-CM

## 2024-11-09 LAB — POCT URINALYSIS DIP (MANUAL ENTRY)
Bilirubin, UA: NEGATIVE
Blood, UA: NEGATIVE
Glucose, UA: NEGATIVE mg/dL
Leukocytes, UA: NEGATIVE
Nitrite, UA: NEGATIVE
Spec Grav, UA: 1.025 (ref 1.010–1.025)
Urobilinogen, UA: 0.2 U/dL
pH, UA: 6.5 (ref 5.0–8.0)

## 2024-11-09 LAB — BASIC METABOLIC PANEL WITH GFR
Anion gap: 8 (ref 5–15)
BUN: 7 mg/dL (ref 6–20)
CO2: 26 mmol/L (ref 22–32)
Calcium: 9.4 mg/dL (ref 8.9–10.3)
Chloride: 104 mmol/L (ref 98–111)
Creatinine, Ser: 0.71 mg/dL (ref 0.44–1.00)
GFR, Estimated: >60 mL/min (ref >60)
Glucose, Bld: 115 mg/dL — ABNORMAL HIGH (ref 70–99)
Potassium: 4.5 mmol/L (ref 3.5–5.1)
Sodium: 138 mmol/L (ref 135–145)

## 2024-11-09 LAB — POCT URINE PREGNANCY: Preg Test, Ur: NEGATIVE

## 2024-11-09 NOTE — ED Provider Notes (Signed)
 [facility]    CSN: [id number] Arrival date & time: 11/09/24  1428      History   Chief Complaint Chief Complaint  Patient presents with   Urinary Frequency    HPI Amber Collins is a 38 y.o. female. Pt c/o urinary frequency, feels like she urinates more often than other people. Urinates normal amounts when she voids. Denies dysuria, abd pain, flank pain, vaginal discharge, n/v, fever/chills. Thinks sx started when she was + for UTI last summer and was treated, feels like either her sx never went away or maybe they went away and then came back. Does not think she drinks excess fluids daily, only drinks water and apple juice. Does not feel increased thirst or hunger. No hx diabetes, no known kidney disease   Urinary Frequency    Past Medical History:  Diagnosis Date   High cholesterol    PCOS (polycystic ovarian syndrome)     There are no active problems to display for this patient.   History reviewed. No pertinent surgical history.  OB History   No obstetric history on file.      Home Medications    Prior to Admission medications   Medication Sig Start Date End Date Taking? Authorizing Provider  acetaminophen  (TYLENOL ) 325 MG tablet Take 2 tablets (650 mg total) by mouth every 6 (six) hours as needed for moderate pain. Patient not taking: Reported on 07/13/2024 11/30/22   Christopher Savannah, PA-C  amoxicillin -clavulanate (AUGMENTIN ) 875-125 MG tablet Take 1 tablet by mouth 2 (two) times daily. Patient not taking: Reported on 07/13/2024 11/30/22   Christopher Savannah, PA-C  cetirizine  (ZYRTEC  ALLERGY) 10 MG tablet Take 1 tablet (10 mg total) by mouth daily. Patient not taking: Reported on 07/13/2024 11/30/22   Christopher Savannah, PA-C  cyclobenzaprine  (FLEXERIL ) 10 MG tablet Take 1 tablet (10 mg total) by mouth 2 (two) times daily as needed for muscle spasms. Patient not taking: Reported on 07/13/2024 08/25/23   Yolande Lamar BROCKS, MD  metroNIDAZOLE  (FLAGYL ) 500 MG tablet Take 1  tablet (500 mg total) by mouth 2 (two) times daily. 07/15/24   Vonna Sharlet POUR, MD  pseudoephedrine  (SUDAFED) 60 MG tablet Take 1 tablet (60 mg total) by mouth every 8 (eight) hours as needed for congestion. Patient not taking: Reported on 07/13/2024 11/30/22   Christopher Savannah, PA-C    Family History History reviewed. No pertinent family history.  Social History Social History   Tobacco Use   Smoking status: Never   Smokeless tobacco: Never  Vaping Use   Vaping status: Never Used  Substance Use Topics   Alcohol use: No   Drug use: No     Allergies   Patient has no known allergies.   Review of Systems Review of Systems  Genitourinary:  Positive for frequency.     Physical Exam Triage Vital Signs ED Triage Vitals  Encounter Vitals Group     BP 11/09/24 1522 134/74     Girls Systolic BP Percentile --      Girls Diastolic BP Percentile --      Boys Systolic BP Percentile --      Boys Diastolic BP Percentile --      Pulse Rate 11/09/24 1522 94     Resp 11/09/24 1522 20     Temp 11/09/24 1522 98 F (36.7 C)     Temp src --      SpO2 11/09/24 1522 94 %     Weight --  Height --      Head Circumference --      Peak Flow --      Pain Score 11/09/24 1524 5     Pain Loc --      Pain Education --      Exclude from Growth Chart --    No data found.  Updated Vital Signs BP 134/74   Pulse 94   Temp 98 F (36.7 C)   Resp 20   LMP 11/02/2024 (Approximate)   SpO2 94%   Visual Acuity Right Eye Distance:   Left Eye Distance:   Bilateral Distance:    Right Eye Near:   Left Eye Near:    Bilateral Near:     Physical Exam Constitutional:      General: She is not in acute distress.    Appearance: Normal appearance. She is not ill-appearing.  Cardiovascular:     Rate and Rhythm: Normal rate and regular rhythm.  Pulmonary:     Effort: Pulmonary effort is normal.     Breath sounds: Normal breath sounds.  Abdominal:     General: Abdomen is flat. Bowel sounds  are normal.     Tenderness: There is no abdominal tenderness. There is no right CVA tenderness, left CVA tenderness, guarding or rebound.  Neurological:     Mental Status: She is alert.      UC Treatments / Results  Labs (all labs ordered are listed, but only abnormal results are displayed) Labs Reviewed  POCT URINALYSIS DIP (MANUAL ENTRY) - Abnormal; Notable for the following components:      Result Value   Clarity, UA hazy (*)    Ketones, POC UA trace (5) (*)    Protein Ur, POC trace (*)    All other components within normal limits  BASIC METABOLIC PANEL WITH GFR  POCT URINE PREGNANCY    EKG   Radiology No results found.  Procedures Procedures (including critical care time)  Medications Ordered in UC Medications - No data to display  Initial Impression / Assessment and Plan / UC Course  I have reviewed the triage vital signs and the nursing notes.  Pertinent labs & imaging results that were available during my care of the patient were reviewed by me and considered in my medical decision making (see chart for details).    Not sure of cause of urinary frequency. UA does not show UTI. Will check kidney function. If sx continue, she should see pcp.   Final Clinical Impressions(s) / UC Diagnoses   Final diagnoses:  Urinary frequency     Discharge Instructions      You will get a call if tests are positive or abnormal, you will not get a call if tests are negative  or normal but you can check results in MyChart if you have a MyChart account.    If your symptoms continue, you should follow up with your primary care provider. If you do not have a primary care provider, and you want to find one at [facility], go to [email or web address], click on primary care, click on new patients: schedule online, choose internal medicine or family medicine, answer a series of questions, and choose an appointment location and time that fits your schedule.     ED Prescriptions    None    PDMP not reviewed this encounter.   Richad Jon HERO, NP 11/09/24 [phone]

## 2024-11-09 NOTE — ED Triage Notes (Signed)
 PT reports she has had urinary frequency and urine has a odor . Pt also reports urine is cloudy. P thas had Sx's for several months.

## 2024-11-09 NOTE — Discharge Instructions (Signed)
 You will get a call if tests are positive or abnormal, you will not get a call if tests are negative  or normal but you can check results in MyChart if you have a MyChart account.    If your symptoms continue, you should follow up with your primary care provider. If you do not have a primary care provider, and you want to find one at [facility], go to [email or web address], click on primary care, click on new patients: schedule online, choose internal medicine or family medicine, answer a series of questions, and choose an appointment location and time that fits your schedule.

## 2024-11-11 ENCOUNTER — Ambulatory Visit (HOSPITAL_COMMUNITY): Payer: Self-pay
